# Patient Record
Sex: Male | Born: 1965 | Race: White | Hispanic: No | Marital: Married | Smoking: Current every day smoker
Health system: Southern US, Community
[De-identification: ages and names within clinical notes are randomized; demographics above are authoritative.]

## PROBLEM LIST (undated history)

## (undated) DIAGNOSIS — F419 Anxiety disorder, unspecified: Secondary | ICD-10-CM

## (undated) DIAGNOSIS — I10 Essential (primary) hypertension: Secondary | ICD-10-CM

---

## 2015-02-05 ENCOUNTER — Emergency Department (HOSPITAL_COMMUNITY)
Admission: EM | Admit: 2015-02-05 | Discharge: 2015-02-05 | Disposition: A | Discharge status: Home / Self Care | Payer: Self-pay | Attending: Emergency Medicine | Admitting: Emergency Medicine

## 2015-02-05 ENCOUNTER — Encounter (HOSPITAL_COMMUNITY): Payer: Self-pay | Admitting: *Deleted

## 2015-02-05 ENCOUNTER — Emergency Department (HOSPITAL_COMMUNITY): Payer: Self-pay

## 2015-02-05 DIAGNOSIS — Z1812 Retained nonmagnetic metal fragments: Secondary | ICD-10-CM | POA: Insufficient documentation

## 2015-02-05 DIAGNOSIS — Y288XXA Contact with other sharp object, undetermined intent, initial encounter: Secondary | ICD-10-CM | POA: Insufficient documentation

## 2015-02-05 DIAGNOSIS — M795 Residual foreign body in soft tissue: Secondary | ICD-10-CM

## 2015-02-05 DIAGNOSIS — I10 Essential (primary) hypertension: Secondary | ICD-10-CM | POA: Insufficient documentation

## 2015-02-05 DIAGNOSIS — S61411A Laceration without foreign body of right hand, initial encounter: Secondary | ICD-10-CM

## 2015-02-05 DIAGNOSIS — Z720 Tobacco use: Secondary | ICD-10-CM | POA: Insufficient documentation

## 2015-02-05 DIAGNOSIS — Z23 Encounter for immunization: Secondary | ICD-10-CM | POA: Insufficient documentation

## 2015-02-05 DIAGNOSIS — M60241 Foreign body granuloma of soft tissue, not elsewhere classified, right hand: Secondary | ICD-10-CM | POA: Insufficient documentation

## 2015-02-05 DIAGNOSIS — Y990 Civilian activity done for income or pay: Secondary | ICD-10-CM | POA: Insufficient documentation

## 2015-02-05 DIAGNOSIS — Y9289 Other specified places as the place of occurrence of the external cause: Secondary | ICD-10-CM | POA: Insufficient documentation

## 2015-02-05 DIAGNOSIS — Y9389 Activity, other specified: Secondary | ICD-10-CM | POA: Insufficient documentation

## 2015-02-05 DIAGNOSIS — S61421A Laceration with foreign body of right hand, initial encounter: Secondary | ICD-10-CM | POA: Insufficient documentation

## 2015-02-05 HISTORY — DX: Essential (primary) hypertension: I10

## 2015-02-05 HISTORY — DX: Anxiety disorder, unspecified: F41.9

## 2015-02-05 MED ORDER — LIDOCAINE HCL (PF) 1 % IJ SOLN
10.0000 mL | Freq: Once | INTRAMUSCULAR | Status: AC
  Administered 2015-02-05: 10 mL
  Filled 2015-02-05: qty 10

## 2015-02-05 MED ORDER — TETANUS-DIPHTH-ACELL PERTUSSIS 5-2.5-18.5 LF-MCG/0.5 IM SUSP
0.5000 mL | Freq: Once | INTRAMUSCULAR | Status: AC
  Administered 2015-02-05: 0.5 mL via INTRAMUSCULAR
  Filled 2015-02-05: qty 0.5

## 2015-02-05 MED ORDER — AMOXICILLIN-POT CLAVULANATE 875-125 MG PO TABS: 1.0000 | ORAL_TABLET | Freq: Two times a day (BID) | ORAL | Status: AC

## 2015-02-05 NOTE — ED Provider Notes (Signed)
CSN: [id number]     Arrival date & time 02/05/15  1214 History  This chart was scribed for non-physician practitioner, Allen Derry, PA-C,working with Toy Cookey, MD, by Karle Plumber, ED Scribe. This patient was seen in room TR08C/TR08C and the patient's care was started at 12:36 PM.  Chief Complaint  Patient presents with  . Laceration   Patient is a 49 y.o. male presenting with skin laceration. The history is provided by the patient and medical records. No language interpreter was used.  Laceration Location:  Hand Hand laceration location:  R hand Depth:  Through underlying tissue Quality: straight   Bleeding: controlled   Time since incident:  3 hours Laceration mechanism:  Metal edge ("carbon grinder") Pain details:    Severity:  No pain   Progression:  Unchanged Foreign body present: "black stuff" from the grinder. Relieved by:  None tried Worsened by:  Nothing tried Ineffective treatments:  None tried Tetanus status:  Out of date   HPI Comments:  Troy Keller is a 49 y.o. male with a PMHx of HTN and anxiety, who presents to the Emergency Department complaining of a laceration to his right hand that occurred 3 hours ago. Pt states he cut the hand on a metal carbon grinder at work. He reports associated bleeding that has since resolved. Pt was seen at urgent care center in [place] and was instructed to come here for further evaluation. He has not done anything to treat the laceration. Denies modifying factors. Denies pain, numbness, tingling or weaknes of the right hand, nausea, vomiting, fever, chills, right wrist or finger injury. He states his last tetanus vaccination was 8-10 years ago. He reports he is ambidextrous.   Past Medical History  Diagnosis Date  . Hypertension   . Anxiety    History reviewed. No pertinent past surgical history. History reviewed. No pertinent family history. History  Substance Use Topics  . Smoking status: Current Every Day  Smoker    Types: Cigarettes  . Smokeless tobacco: Never Used  . Alcohol Use: Yes     Comment: social    Review of Systems  Musculoskeletal: Negative for myalgias and arthralgias.  Skin: Positive for wound.  Allergic/Immunologic: Negative for immunocompromised state.  Neurological: Negative for weakness.  Hematological: Does not bruise/bleed easily.   A complete 10 system review of systems was obtained and all systems are negative except as noted in the HPI and PMH.   Allergies  Review of patient's allergies indicates no known allergies.  Home Medications   Prior to Admission medications   Not on File   Triage Vitals: BP 140/91 mmHg  Pulse 71  Temp(Src) 97.7 F (36.5 C) (Oral)  Resp 18  Ht  (1.727 m)  Wt 177 lb (80.287 kg)  BMI 26.92 kg/m2  SpO2 100% Physical Exam  Constitutional: He is oriented to person, place, and time. Vital signs are normal. He appears well-developed and well-nourished. No distress.  Afebrile nontoxic NAD  HENT:  Head: Normocephalic and atraumatic.  Mouth/Throat: Mucous membranes are normal.  Eyes: Conjunctivae and EOM are normal. Right eye exhibits no discharge. Left eye exhibits no discharge.  Neck: Normal range of motion. Neck supple.  Cardiovascular: Normal rate and intact distal pulses.   Distal pulses intact, cap refill brisk and present in all digits  Pulmonary/Chest: Effort normal. No respiratory distress.  Abdominal: Normal appearance. He exhibits no distension.  Musculoskeletal: Normal range of motion.       Right hand: He exhibits laceration. He  exhibits normal range of motion, no tenderness, normal two-point discrimination, normal capillary refill and no swelling. Normal strength noted.       Hands: Full ROM of right wrist and all digits of right hand intact, FROM intact at first CMC, MCP, and DIP joints of R thumb. Strength grossly intact including with finger abduction and opposition. Sensation grossly intact. Cap refill brisk  and present. Distal pulses intact. Laceration over right 1st metacarpal as noted below in skin section, and as depicted below in photographs  Neurological: He is alert and oriented to person, place, and time. He has normal strength. No sensory deficit.  Skin: Skin is warm and dry. Laceration noted.  ~5 cm laceration to the right dorsal aspect of the hand over the first metacarpal, superficial with exposed subcutaneous fat but intact fascia, no ongoing bleeding, with blackened wound edges noted. SEE PICTURE BELOW  Psychiatric: He has a normal mood and affect. His behavior is normal.  Nursing note and vitals reviewed.       ED Course  LACERATION REPAIR Date/Time: 02/05/2015 1:51 PM Performed by: CAMPRUBI-SOMS, Robert Sunga STRUPP Authorized by: Ramond MarrowAMPRUBI-SOMS, Venola Castello STRUPP Consent: Verbal consent obtained. Risks and benefits: risks, benefits and alternatives were discussed Consent given by: patient Patient understanding: patient states understanding of the procedure being performed Patient consent: the patient's understanding of the procedure matches consent given Patient identity confirmed: verbally with patient Body area: upper extremity Location details: right thumb Laceration length: 5 cm Contamination: The wound is contaminated. Foreign bodies: metal Tendon involvement: none Nerve involvement: none Vascular damage: no Anesthesia: local infiltration Local anesthetic: lidocaine 1% without epinephrine Anesthetic total: 2 ml Patient sedated: no Preparation: Patient was prepped and draped in the usual sterile fashion. Irrigation solution: saline Irrigation method: jet lavage Amount of cleaning: extensive Debridement: moderate Degree of undermining: none Skin closure: 4-0 Prolene Number of sutures: 4 Technique: simple Approximation: close Approximation difficulty: simple Dressing: 4x4 sterile gauze Patient tolerance: Patient tolerated the procedure well with no immediate  complications Comments: Soaked in betadine with normal saline, debrided wound edges, removed 3-4 small fragments of blackened tissue believed to be FBs noted on xray, then soaked in saline again to continue debridement of wound edges. Scrubbed edges to attempt to remove all blackened areas.    (including critical care time) DIAGNOSTIC STUDIES: Oxygen Saturation is 100% on RA, normal by my interpretation.   COORDINATION OF CARE: 12:42 PM- Offered pain medication but pt declined. Will X-Ray right hand, update tetanus and suture laceration. Pt verbalizes understanding and agrees to plan.  LACERATION REPAIR PROCEDURE NOTE The patient's identification was confirmed and consent was obtained. This procedure was performed by Levi StraussMercedes Camprubi-Soms, PA-C at 1:51 PM. Site: right dorsal hand Sterile procedures observed Anesthetic used (type and amt): Lidocaine 1% without Epinephrine (2 mLs) Suture type/size: 4-0 Prolene Length: 5 cm # of Sutures: simple, interrupted Technique: simple Complexity: simple Antibx ointment applied Tetanus ordered Site anesthetized, irrigated with NS, explored without evidence of foreign body, wound well approximated, site covered with dry, sterile dressing.  Patient tolerated procedure well without complications. Instructions for care discussed verbally and patient provided with additional written instructions for homecare and f/u.  Medications  Tdap (BOOSTRIX) injection 0.5 mL (not administered)  lidocaine (PF) (XYLOCAINE) 1 % injection 10 mL (10 mLs Infiltration Given 02/05/15 1312)    Labs Review Labs Reviewed - No data to display  Imaging Review Dg Finger Thumb Right  02/05/2015   CLINICAL DATA:  Laceration posterior lateral right hand from grinder.  EXAM: RIGHT THUMB 2+V  COMPARISON:  None.  FINDINGS: Examination demonstrates no evidence of acute fracture or dislocation. Mild degenerative change of the first carpometacarpal joint. Evidence of patient's  laceration at the level of the first metacarpal. Tiny punctate radiopaque foreign bodies within the soft tissues over the laceration. Benign well-defined sclerotic border lucency over the first distal phalanx.  IMPRESSION: Laceration within the soft tissues adjacent the first metacarpal with punctate foreign bodies within the soft tissues over the laceration. No underlying bony injury.   Electronically Signed   By: Elberta Fortis M.D.   On: 02/05/2015 13:44     EKG Interpretation None      MDM   Final diagnoses:  Hand laceration, right, initial encounter  Foreign body (FB) in soft tissue    50 y.o. male with a hand laceration extending to the subcutaneous fat but sparing any tendon or nerve involvement. Neurovascularly intact with soft compartments. Brisk cap refill. FROM intact in R thumb/hand/wrist. Xray obtained which revealed small punctate FBs. Extensive debridement was performed after soaking in betadine/saline solution and again in sterile saline. Multiple small black fragments removed, possibly the FBs that were visualized on xray, but unable to remove all of the blackened edges. Cleaned extensively down to bed of wound. Sutured closed with 4-0 prolene x4 sutures. Tetanus updated. Given augmentin for home. Will have him use tylenol/motrin/RICE therapy for pain control, pt declined wanting anything more. Pt has appt with PCP in 7-10 days, will have him f/up there for recheck and suture removal. Not immobilized here since it doesn't cross a joint, but discussed limiting his thumb activities to avoid the sutures opening prematurely. Return precautions given for s/sx of infection. I explained the diagnosis and have given explicit precautions to return to the ER including for any other new or worsening symptoms. The patient understands and accepts the medical plan as it's been dictated and I have answered their questions. Discharge instructions concerning home care and prescriptions have been given.  The patient is STABLE and is discharged to home in good condition.   I personally performed the services described in this documentation, which was scribed in my presence. The recorded information has been reviewed and is accurate.  BP 140/91 mmHg  Pulse 71  Temp(Src) 97.7 F (36.5 C) (Oral)  Resp 18  Ht  (1.727 m)  Wt 177 lb (80.287 kg)  BMI 26.92 kg/m2  SpO2 100%  Meds ordered this encounter  Medications  . Tdap (BOOSTRIX) injection 0.5 mL    Sig:   . lidocaine (PF) (XYLOCAINE) 1 % injection 10 mL    Sig:   . amoxicillin-clavulanate (AUGMENTIN) 875-125 MG per tablet    Sig: Take 1 tablet by mouth 2 (two) times daily. One po bid x 7 days    Dispense:  14 tablet    Refill:  0    Order Specific Question:  Supervising Provider    Answer:  Vida Roller [address] Camprubi-Soms, PA-C 02/05/15 1453  Toy Cookey, MD 02/05/15 [phone]

## 2015-02-05 NOTE — ED Notes (Signed)
Declined W/C at D/C and was escorted to lobby by RN. 

## 2015-02-05 NOTE — Discharge Instructions (Signed)
Keep wound and clean with mild soap and water. Keep area covered with a topical antibiotic ointment and bandage, keep bandage dry, and do not submerge in water for 24 hours. Ice and elevate for additional pain relief and swelling. Alternate between Ibuprofen and Tylenol for additional pain relief. Follow up with your primary care doctor or the [facility] in approximately 7-10 days for wound recheck and suture removal. Monitor area for signs of infection to include, but not limited to: increasing pain, redness, drainage/pus, or swelling. Please take all of your antibiotics until finished!   You may develop abdominal discomfort or diarrhea from the antibiotic.  You may help offset this with probiotics which you can buy or get in yogurt. Do not eat  or take the probiotics until 2 hours after your antibiotic.  Return to emergency department for emergent changing or worsening symptoms.    Laceration Care, Adult A laceration is a cut that goes through all layers of the skin. The cut goes into the tissue beneath the skin. HOME CARE For stitches (sutures) or staples:  Keep the cut clean and dry.  If you have a bandage (dressing), change it at least once a day. Change the bandage if it gets wet or dirty, or as told by your doctor.  Wash the cut with soap and water 2 times a day. Rinse the cut with water. Pat it dry with a clean towel.  Put a thin layer of medicated cream on the cut as told by your doctor.  You may shower after the first 24 hours. Do not soak the cut in water until the stitches are removed.  Only take medicines as told by your doctor.  Have your stitches or staples removed as told by your doctor. For skin adhesive strips:  Keep the cut clean and dry.  Do not get the strips wet. You may take a bath, but be careful to keep the cut dry.  If the cut gets wet, pat it dry with a clean towel.  The strips will fall off on their own. Do not remove the strips that are  still stuck to the cut. For wound glue:  You may shower or take baths. Do not soak or scrub the cut. Do not swim. Avoid heavy sweating until the glue falls off on its own. After a shower or bath, pat the cut dry with a clean towel.  Do not put medicine on your cut until the glue falls off.  If you have a bandage, do not put tape over the glue.  Avoid lots of sunlight or tanning lamps until the glue falls off. Put sunscreen on the cut for the first year to reduce your scar.  The glue will fall off on its own. Do not pick at the glue. You may need a tetanus shot if:  You cannot remember when you had your last tetanus shot.  You have never had a tetanus shot. If you need a tetanus shot and you choose not to have one, you may get tetanus. Sickness from tetanus can be serious. GET HELP RIGHT AWAY IF:   Your pain does not get better with medicine.  Your arm, hand, leg, or foot loses feeling (numbness) or changes color.  Your cut is bleeding.  Your joint feels weak, or you cannot use your joint.  You have painful lumps on your body.  Your cut is red, puffy (swollen), or painful.  You have a red line on the  skin near the cut.  You have yellowish-white fluid (pus) coming from the cut.  You have a fever.  You have a bad smell coming from the cut or bandage.  Your cut breaks open before or after stitches are removed.  You notice something coming out of the cut, such as wood or glass.  You cannot move a finger or toe. MAKE SURE YOU:   Understand these instructions.  Will watch your condition.  Will get help right away if you are not doing well or get worse. Document Released: 06/04/2008 Document Revised: 03/10/2012 Document Reviewed: 06/12/2011 [facility] Patient Information 2015 [place], [place]. This information is not intended to replace advice given to you by your health care provider. Make sure you discuss any questions you have with your health care provider.  Stitches,  Staples, or Skin Adhesive Strips  Stitches (sutures), staples, and skin adhesive strips hold the skin together as it heals. They will usually be in place for 7 days or less. HOME CARE  Wash your hands with soap and water before and after you touch your wound.  Only take medicine as told by your doctor.  Cover your wound only if your doctor told you to. Otherwise, leave it open to air.  Do not get your stitches wet or dirty. If they get dirty, dab them gently with a clean washcloth. Wet the washcloth with soapy water. Do not rub. Pat them dry gently.  Do not put medicine or medicated cream on your stitches unless your doctor told you to.  Do not take out your own stitches or staples. Skin adhesive strips will fall off by themselves.  Do not pick at the wound. Picking can cause an infection.  Do not miss your follow-up appointment.  If you have problems or questions, call your doctor. GET HELP RIGHT AWAY IF:   You have a temperature by mouth above 102 F (38.9 C), not controlled by medicine.  You have chills.  You have redness or pain around your stitches.  There is puffiness (swelling) around your stitches.  You notice fluid (drainage) from your stitches.  There is a bad smell coming from your wound. MAKE SURE YOU:  Understand these instructions.  Will watch your condition.  Will get help if you are not doing well or get worse. Document Released: 10/14/2009 Document Revised: 03/10/2012 Document Reviewed: 10/14/2009 [facility] Patient Information 2015 [place], [place]. This information is not intended to replace advice given to you by your health care provider. Make sure you discuss any questions you have with your health care provider.  Wound Care Wound care helps prevent pain and infection.  You may need a tetanus shot if:  You cannot remember when you had your last tetanus shot.  You have never had a tetanus shot.  The injury broke your skin. If you need a tetanus  shot and you choose not to have one, you may get tetanus. Sickness from tetanus can be serious. HOME CARE   Only take medicine as told by your doctor.  Clean the wound daily with mild soap and water.  Change any bandages (dressings) as told by your doctor.  Put medicated cream and a bandage on the wound as told by your doctor.  Change the bandage if it gets wet, dirty, or starts to smell.  Take showers. Do not take baths, swim, or do anything that puts your wound under water.  Rest and raise (elevate) the wound until the pain and puffiness (swelling) are better.  Keep all doctor visits as told. GET HELP RIGHT AWAY IF:   Yellowish-white fluid (pus) comes from the wound.  Medicine does not lessen your pain.  There is a red streak going away from the wound.  You have a fever. MAKE SURE YOU:   Understand these instructions.  Will watch your condition.  Will get help right away if you are not doing well or get worse. Document Released: 09/25/2008 Document Revised: 03/10/2012 Document Reviewed: 04/22/2011 [facility] Patient Information 2015 [place], [place]. This information is not intended to replace advice given to you by your health care provider. Make sure you discuss any questions you have with your health care provider.

## 2015-02-05 NOTE — ED Notes (Signed)
Prepared beta-dye and saline solution for patient to soak hand in.

## 2015-11-11 DIAGNOSIS — M159 Polyosteoarthritis, unspecified: Secondary | ICD-10-CM | POA: Insufficient documentation

## 2015-11-11 DIAGNOSIS — F17200 Nicotine dependence, unspecified, uncomplicated: Secondary | ICD-10-CM | POA: Insufficient documentation

## 2015-11-11 DIAGNOSIS — F419 Anxiety disorder, unspecified: Secondary | ICD-10-CM | POA: Insufficient documentation

## 2015-11-11 DIAGNOSIS — M150 Primary generalized (osteo)arthritis: Secondary | ICD-10-CM

## 2015-11-11 DIAGNOSIS — J301 Allergic rhinitis due to pollen: Secondary | ICD-10-CM | POA: Insufficient documentation

## 2015-11-11 DIAGNOSIS — I10 Essential (primary) hypertension: Secondary | ICD-10-CM | POA: Insufficient documentation

## 2015-11-11 DIAGNOSIS — T6701XA Heatstroke and sunstroke, initial encounter: Secondary | ICD-10-CM | POA: Insufficient documentation

## 2017-03-05 DIAGNOSIS — F4322 Adjustment disorder with anxiety: Secondary | ICD-10-CM | POA: Insufficient documentation

## 2017-05-01 DIAGNOSIS — R072 Precordial pain: Secondary | ICD-10-CM | POA: Insufficient documentation

## 2017-06-05 DIAGNOSIS — S20212A Contusion of left front wall of thorax, initial encounter: Secondary | ICD-10-CM | POA: Insufficient documentation

## 2017-09-18 DIAGNOSIS — S6991XA Unspecified injury of right wrist, hand and finger(s), initial encounter: Secondary | ICD-10-CM | POA: Insufficient documentation

## 2018-03-05 DIAGNOSIS — R0609 Other forms of dyspnea: Secondary | ICD-10-CM | POA: Insufficient documentation

## 2018-03-05 DIAGNOSIS — R5381 Other malaise: Secondary | ICD-10-CM | POA: Insufficient documentation

## 2018-03-05 DIAGNOSIS — R5383 Other fatigue: Secondary | ICD-10-CM

## 2018-09-11 DIAGNOSIS — F4310 Post-traumatic stress disorder, unspecified: Secondary | ICD-10-CM | POA: Insufficient documentation

## 2018-09-11 DIAGNOSIS — F410 Panic disorder [episodic paroxysmal anxiety] without agoraphobia: Secondary | ICD-10-CM | POA: Insufficient documentation

## 2019-03-10 ENCOUNTER — Other Ambulatory Visit: Payer: Self-pay

## 2019-03-16 ENCOUNTER — Ambulatory Visit: Payer: Self-pay | Admitting: Cardiology

## 2019-09-15 ENCOUNTER — Encounter (HOSPITAL_COMMUNITY): Payer: Self-pay | Admitting: Emergency Medicine

## 2019-09-15 ENCOUNTER — Emergency Department (HOSPITAL_COMMUNITY): Payer: Worker's Compensation

## 2019-09-15 ENCOUNTER — Other Ambulatory Visit: Payer: Self-pay

## 2019-09-15 ENCOUNTER — Emergency Department (HOSPITAL_COMMUNITY)
Admission: EM | Admit: 2019-09-15 | Discharge: 2019-09-15 | Disposition: A | Discharge status: Home / Self Care | Payer: Worker's Compensation | Attending: Emergency Medicine | Admitting: Emergency Medicine

## 2019-09-15 DIAGNOSIS — Z23 Encounter for immunization: Secondary | ICD-10-CM | POA: Insufficient documentation

## 2019-09-15 DIAGNOSIS — S3991XA Unspecified injury of abdomen, initial encounter: Secondary | ICD-10-CM | POA: Insufficient documentation

## 2019-09-15 DIAGNOSIS — Z79899 Other long term (current) drug therapy: Secondary | ICD-10-CM | POA: Insufficient documentation

## 2019-09-15 DIAGNOSIS — Y999 Unspecified external cause status: Secondary | ICD-10-CM | POA: Diagnosis not present

## 2019-09-15 DIAGNOSIS — Y939 Activity, unspecified: Secondary | ICD-10-CM | POA: Insufficient documentation

## 2019-09-15 DIAGNOSIS — Y929 Unspecified place or not applicable: Secondary | ICD-10-CM | POA: Diagnosis not present

## 2019-09-15 DIAGNOSIS — M545 Low back pain: Secondary | ICD-10-CM | POA: Diagnosis present

## 2019-09-15 DIAGNOSIS — S0102XA Laceration with foreign body of scalp, initial encounter: Secondary | ICD-10-CM | POA: Insufficient documentation

## 2019-09-15 DIAGNOSIS — S32028A Other fracture of second lumbar vertebra, initial encounter for closed fracture: Secondary | ICD-10-CM | POA: Insufficient documentation

## 2019-09-15 DIAGNOSIS — I10 Essential (primary) hypertension: Secondary | ICD-10-CM | POA: Diagnosis not present

## 2019-09-15 DIAGNOSIS — S70312A Abrasion, left thigh, initial encounter: Secondary | ICD-10-CM | POA: Diagnosis not present

## 2019-09-15 LAB — COMPREHENSIVE METABOLIC PANEL
ALT: 19 U/L (ref 0–44)
AST: 22 U/L (ref 15–41)
Albumin: 3.8 g/dL (ref 3.5–5.0)
Alkaline Phosphatase: 55 U/L (ref 38–126)
Anion gap: 8 (ref 5–15)
BUN: 25 mg/dL — ABNORMAL HIGH (ref 6–20)
CO2: 27 mmol/L (ref 22–32)
Calcium: 9.2 mg/dL (ref 8.9–10.3)
Chloride: 104 mmol/L (ref 98–111)
Creatinine, Ser: 1.22 mg/dL (ref 0.61–1.24)
GFR calc Af Amer: >60 mL/min (ref >60)
GFR calc non Af Amer: >60 mL/min (ref >60)
Glucose, Bld: 110 mg/dL — ABNORMAL HIGH (ref 70–99)
Potassium: 4.6 mmol/L (ref 3.5–5.1)
Sodium: 139 mmol/L (ref 135–145)
Total Bilirubin: 0.4 mg/dL (ref 0.3–1.2)
Total Protein: 6.7 g/dL (ref 6.5–8.1)

## 2019-09-15 LAB — ETHANOL: Alcohol, Ethyl (B): <10 mg/dL (ref <10)

## 2019-09-15 LAB — URINALYSIS, ROUTINE W REFLEX MICROSCOPIC
Bilirubin Urine: NEGATIVE
Glucose, UA: NEGATIVE mg/dL
Hgb urine dipstick: NEGATIVE
Ketones, ur: NEGATIVE mg/dL
Leukocytes,Ua: NEGATIVE
Nitrite: NEGATIVE
Protein, ur: NEGATIVE mg/dL
Specific Gravity, Urine: >1.03 — ABNORMAL HIGH (ref 1.005–1.030)
pH: 6 (ref 5.0–8.0)

## 2019-09-15 LAB — I-STAT CHEM 8, ED
BUN: 27 mg/dL — ABNORMAL HIGH (ref 6–20)
Calcium, Ion: 1.2 mmol/L (ref 1.15–1.40)
Chloride: 106 mmol/L (ref 98–111)
Creatinine, Ser: 1.2 mg/dL (ref 0.61–1.24)
Glucose, Bld: 101 mg/dL — ABNORMAL HIGH (ref 70–99)
HCT: 44 % (ref 39.0–52.0)
Hemoglobin: 15 g/dL (ref 13.0–17.0)
Potassium: 4.5 mmol/L (ref 3.5–5.1)
Sodium: 142 mmol/L (ref 135–145)
TCO2: 27 mmol/L (ref 22–32)

## 2019-09-15 LAB — CBC
HCT: 43.2 % (ref 39.0–52.0)
Hemoglobin: 14.7 g/dL (ref 13.0–17.0)
MCH: 34.9 pg — ABNORMAL HIGH (ref 26.0–34.0)
MCHC: 34 g/dL (ref 30.0–36.0)
MCV: 102.6 fL — ABNORMAL HIGH (ref 80.0–100.0)
Platelets: 333 10*3/uL (ref 150–400)
RBC: 4.21 MIL/uL — ABNORMAL LOW (ref 4.22–5.81)
RDW: 13.5 % (ref 11.5–15.5)
WBC: 10.4 10*3/uL (ref 4.0–10.5)
nRBC: 0 % (ref 0.0–0.2)

## 2019-09-15 LAB — CDS SEROLOGY

## 2019-09-15 LAB — PROTIME-INR
INR: 1 (ref 0.8–1.2)
Prothrombin Time: 12.9 seconds (ref 11.4–15.2)

## 2019-09-15 LAB — SAMPLE TO BLOOD BANK

## 2019-09-15 MED ORDER — HYDROCODONE-ACETAMINOPHEN 5-325 MG PO TABS: 1.0000 | ORAL_TABLET | Freq: Four times a day (QID) | ORAL | 0 refills | Status: AC | PRN

## 2019-09-15 MED ORDER — SODIUM CHLORIDE 0.9 % IV BOLUS
1000.0000 mL | Freq: Once | INTRAVENOUS | Status: AC
  Administered 2019-09-15: 1000 mL via INTRAVENOUS

## 2019-09-15 MED ORDER — TETANUS-DIPHTH-ACELL PERTUSSIS 5-2.5-18.5 LF-MCG/0.5 IM SUSP
0.5000 mL | Freq: Once | INTRAMUSCULAR | Status: AC
  Administered 2019-09-15: 12:00:00 0.5 mL via INTRAMUSCULAR
  Filled 2019-09-15: qty 0.5

## 2019-09-15 MED ORDER — LORAZEPAM 2 MG/ML IJ SOLN
1.0000 mg | Freq: Once | INTRAMUSCULAR | Status: AC
  Administered 2019-09-15: 12:00:00 1 mg via INTRAVENOUS
  Filled 2019-09-15: qty 1

## 2019-09-15 MED ORDER — IOHEXOL 300 MG/ML  SOLN
100.0000 mL | Freq: Once | INTRAMUSCULAR | Status: AC | PRN
  Administered 2019-09-15: 13:00:00 100 mL via INTRAVENOUS
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
## 2019-09-15 NOTE — Discharge Instructions (Signed)
Take motrin and tylenol for pain   Take norco for severe pain   Continue your valium as prescribed by your doctor  You have lower lumbar fracture. Take pain meds as prescribed and see Dr. Annette Stable in 2 weeks   Return to ER if you have worse back pain, trouble walking, chest pain, vomiting   Return to ER if you have worse back pain

## 2019-09-15 NOTE — ED Notes (Signed)
Pt verbalized understanding of discharge paperwork and follow-up care.  °
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
## 2023-05-02 DIAGNOSIS — K429 Umbilical hernia without obstruction or gangrene: Secondary | ICD-10-CM | POA: Diagnosis not present

## 2023-05-20 DIAGNOSIS — K429 Umbilical hernia without obstruction or gangrene: Secondary | ICD-10-CM | POA: Diagnosis not present

## 2023-06-05 DIAGNOSIS — Z09 Encounter for follow-up examination after completed treatment for conditions other than malignant neoplasm: Secondary | ICD-10-CM | POA: Diagnosis not present

## 2023-06-05 DIAGNOSIS — K429 Umbilical hernia without obstruction or gangrene: Secondary | ICD-10-CM | POA: Diagnosis not present

## 2023-07-11 DIAGNOSIS — E669 Obesity, unspecified: Secondary | ICD-10-CM | POA: Diagnosis not present

## 2023-07-11 DIAGNOSIS — E785 Hyperlipidemia, unspecified: Secondary | ICD-10-CM | POA: Diagnosis not present

## 2023-07-11 DIAGNOSIS — Z6833 Body mass index (BMI) 33.0-33.9, adult: Secondary | ICD-10-CM | POA: Diagnosis not present

## 2023-07-11 DIAGNOSIS — F411 Generalized anxiety disorder: Secondary | ICD-10-CM | POA: Diagnosis not present

## 2023-07-11 DIAGNOSIS — F4312 Post-traumatic stress disorder, chronic: Secondary | ICD-10-CM | POA: Diagnosis not present

## 2023-07-11 DIAGNOSIS — M25561 Pain in right knee: Secondary | ICD-10-CM | POA: Diagnosis not present

## 2023-07-11 DIAGNOSIS — I10 Essential (primary) hypertension: Secondary | ICD-10-CM | POA: Diagnosis not present

## 2023-07-11 DIAGNOSIS — K219 Gastro-esophageal reflux disease without esophagitis: Secondary | ICD-10-CM | POA: Diagnosis not present

## 2023-07-24 DIAGNOSIS — G8929 Other chronic pain: Secondary | ICD-10-CM | POA: Diagnosis not present

## 2023-07-24 DIAGNOSIS — M25561 Pain in right knee: Secondary | ICD-10-CM | POA: Diagnosis not present

## 2023-08-06 DIAGNOSIS — M180 Bilateral primary osteoarthritis of first carpometacarpal joints: Secondary | ICD-10-CM | POA: Diagnosis not present

## 2023-08-06 DIAGNOSIS — M1811 Unilateral primary osteoarthritis of first carpometacarpal joint, right hand: Secondary | ICD-10-CM | POA: Diagnosis not present

## 2023-08-06 DIAGNOSIS — M1812 Unilateral primary osteoarthritis of first carpometacarpal joint, left hand: Secondary | ICD-10-CM | POA: Diagnosis not present

## 2023-08-20 DIAGNOSIS — Z125 Encounter for screening for malignant neoplasm of prostate: Secondary | ICD-10-CM | POA: Diagnosis not present

## 2023-08-20 DIAGNOSIS — R531 Weakness: Secondary | ICD-10-CM | POA: Diagnosis not present

## 2023-08-20 DIAGNOSIS — N538 Other male sexual dysfunction: Secondary | ICD-10-CM | POA: Diagnosis not present

## 2023-09-16 DIAGNOSIS — M1711 Unilateral primary osteoarthritis, right knee: Secondary | ICD-10-CM | POA: Diagnosis not present
